# Patient Record
Sex: Male | Born: 2005 | Race: Black or African American | Hispanic: No | Marital: Single | Smoking: Never smoker
Health system: Southern US, Community
[De-identification: ages and names within clinical notes are randomized; demographics above are authoritative.]

## PROBLEM LIST (undated history)

## (undated) DIAGNOSIS — E559 Vitamin D deficiency, unspecified: Secondary | ICD-10-CM

## (undated) DIAGNOSIS — L309 Dermatitis, unspecified: Secondary | ICD-10-CM

## (undated) DIAGNOSIS — E7800 Pure hypercholesterolemia, unspecified: Secondary | ICD-10-CM

## (undated) DIAGNOSIS — Z6832 Body mass index (BMI) 32.0-32.9, adult: Secondary | ICD-10-CM

## (undated) DIAGNOSIS — R55 Syncope and collapse: Secondary | ICD-10-CM

## (undated) DIAGNOSIS — K219 Gastro-esophageal reflux disease without esophagitis: Secondary | ICD-10-CM

## (undated) DIAGNOSIS — R011 Cardiac murmur, unspecified: Secondary | ICD-10-CM

## (undated) DIAGNOSIS — Z79899 Other long term (current) drug therapy: Secondary | ICD-10-CM

## (undated) DIAGNOSIS — R519 Headache, unspecified: Secondary | ICD-10-CM

## (undated) DIAGNOSIS — R5383 Other fatigue: Secondary | ICD-10-CM

## (undated) DIAGNOSIS — T7840XA Allergy, unspecified, initial encounter: Secondary | ICD-10-CM

## (undated) DIAGNOSIS — J45909 Unspecified asthma, uncomplicated: Secondary | ICD-10-CM

## (undated) HISTORY — DX: Vitamin D deficiency, unspecified: E55.9

## (undated) HISTORY — DX: Cardiac murmur, unspecified: R01.1

## (undated) HISTORY — DX: Headache, unspecified: R51.9

## (undated) HISTORY — DX: Gastro-esophageal reflux disease without esophagitis: K21.9

## (undated) HISTORY — DX: Pure hypercholesterolemia, unspecified: E78.00

## (undated) HISTORY — DX: Syncope and collapse: R55

## (undated) HISTORY — DX: Allergy, unspecified, initial encounter: T78.40XA

## (undated) HISTORY — DX: Other fatigue: R53.83

## (undated) HISTORY — DX: Other long term (current) drug therapy: Z79.899

## (undated) HISTORY — DX: Body mass index (BMI) 32.0-32.9, adult: Z68.32

## (undated) HISTORY — PX: COLONOSCOPY: SHX174

## (undated) HISTORY — PX: ORCHIECTOMY: SHX2116

---
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
## 2024-02-01 DIAGNOSIS — L219 Seborrheic dermatitis, unspecified: Secondary | ICD-10-CM | POA: Diagnosis not present

## 2024-06-18 ENCOUNTER — Ambulatory Visit (HOSPITAL_COMMUNITY): Payer: Self-pay

## 2024-06-18 ENCOUNTER — Ambulatory Visit (HOSPITAL_COMMUNITY)
Admission: EM | Admit: 2024-06-18 | Discharge: 2024-06-18 | Disposition: A | Discharge status: Home / Self Care | Payer: Self-pay | Source: Home / Self Care

## 2024-06-18 ENCOUNTER — Encounter (HOSPITAL_COMMUNITY): Payer: Self-pay

## 2024-06-18 DIAGNOSIS — M79671 Pain in right foot: Secondary | ICD-10-CM

## 2024-06-18 MED ORDER — IBUPROFEN 800 MG PO TABS: 800.0000 mg | ORAL_TABLET | Freq: Three times a day (TID) | ORAL | 0 refills | Status: AC | PRN

## 2024-06-18 NOTE — ED Provider Notes (Signed)
 " [facility]    CSN: [id number] Arrival date & time: 06/18/24  1704      History   Chief Complaint Chief Complaint  Patient presents with   Toe Pain    HPI Brian Norton is a 19 y.o. male.   This 19 year old male is being seen for complaints of right great toe pain.  He reports 1 week ago he was walking down the stairs and slipped striking his right great toe and foot on the wall.  He reports since that time he has had pain and swelling in his right great toe.  He has taken Tylenol  and ibuprofen  with minimal relief of symptoms.  He has attempted buddy taping without significant relief of symptoms.  He reports numbness in his right great toe.  He denies headache, dizziness.  He denies chest pain, shortness of breath.   Toe Pain Pertinent negatives include no chest pain, no headaches and no shortness of breath.    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 Authorizing Provider  ibuprofen  (ADVIL ) 800 MG tablet Take 1 tablet (800 mg total) by mouth every 8 (eight) hours as needed. 06/18/24  Yes Emanie Behan C, FNP  escitalopram  (LEXAPRO ) 5 MG tablet Take 1 tablet (5 mg total) by mouth at bedtime. Start with 1/2 a  tablet at bedtime, then increase to 1 tablet at bedtime. 08/18/20 09/17/20  Qayumi, Zainab S, MD  fluticasone  (FLOVENT  HFA) 110 MCG/ACT inhaler INHALE 1 PUFF BY MOUTH TWICE DAILY ,  SICK  OR  WELL 10/03/21   Rendell Grumet, MD  mupirocin  ointment (BACTROBAN ) 2 % Apply 1 application topically 2 (two) times daily. 09/28/20   Rendell Grumet, MD  naproxen  (NAPROSYN ) 375 MG tablet Take 1 tablet (375 mg total) by mouth 2 (two) times daily. 03/10/23   Randol Simmonds, MD  pantoprazole  (PROTONIX ) 40 MG tablet Take 1 tablet by mouth once daily 06/14/20   Rendell Grumet, MD  Selenium  Sulfide 2.25 % SHAM Apply 1 application  topically 2 (two) times a week. Apply to damp scalp and shampoo vigorously. 04/27/22   Rendell Grumet, MD  tretinoin  (RETIN-A ) 0.1 % cream Apply topically at bedtime. 04/21/22   Rendell Grumet, MD  VENTOLIN  HFA 108 (90 Base) MCG/ACT inhaler INHALE 2 PUFFS BY MOUTH EVERY 4 HOURS FOR WHEEZING OR SHORTNESS OF BREATH 06/08/22   Akhbari, Rozita, MD    Family History History reviewed. No pertinent family history.  Social History Social History[1]   Allergies   Lactose intolerance (gi) and Lactulose   Review of Systems Review of  Systems  Constitutional:  Positive for activity change. Negative for chills and fever.  Respiratory:  Negative for shortness of breath.   Cardiovascular:  Negative for chest pain.  Musculoskeletal:  Positive for arthralgias and joint swelling.  Skin:  Positive for color change (bruising). Negative for rash.  Neurological:  Negative for dizziness and headaches.  All other systems reviewed and are negative.    Physical Exam Triage Vital Signs ED Triage Vitals  Encounter Vitals Group     BP 06/18/24 1746 115/64     Girls Systolic BP Percentile --      Girls Diastolic BP Percentile --      Boys Systolic BP Percentile --      Boys Diastolic BP Percentile --      Pulse Rate 06/18/24 1746 76     Resp 06/18/24 1746 16     Temp 06/18/24 1746 98.8 F (37.1 C)     Temp Source 06/18/24  1746 Oral     SpO2 06/18/24 1746 96 %     Weight --      Height --      Head Circumference --      Peak Flow --      Pain Score 06/18/24 1747 5     Pain Loc --      Pain Education --      Exclude from Growth Chart --    No data found.  Updated Vital Signs BP 115/64 (BP Location: Left Arm)   Pulse 76   Temp 98.8 F (37.1 C) (Oral)   Resp 16   SpO2 96%   Visual Acuity Right Eye Distance:   Left Eye Distance:   Bilateral Distance:    Right Eye Near:   Left Eye Near:    Bilateral Near:     Physical Exam Vitals and nursing note reviewed.  Constitutional:      General: He is not in acute distress.    Appearance: He is well-developed. He is not toxic-appearing.     Comments: Pleasant male appearing stated age found sitting in chair in no acute distress.  HENT:     Head: Normocephalic and atraumatic.  Eyes:     Conjunctiva/sclera: Conjunctivae normal.  Cardiovascular:     Rate and Rhythm: Normal rate and regular rhythm.     Heart sounds: Normal heart sounds. No murmur heard. Pulmonary:     Effort: Pulmonary effort is normal. No respiratory distress.     Breath sounds: Normal breath sounds.  Musculoskeletal:     Right foot: Decreased range of motion.       Feet:  Feet:     Right foot:     Skin integrity: No skin breakdown, erythema or warmth.     Comments: Decreased range of motion and pain right great toe. Skin:    General: Skin is warm and dry.     Capillary Refill: Capillary refill takes less than 2 seconds.     Findings: Bruising present.     Comments: Right great toe  Neurological:     Mental Status: He is alert and oriented to person, place, and time.  Psychiatric:        Mood and Affect: Mood normal.      UC Treatments / Results  Labs (all labs ordered are listed, but only abnormal results are displayed) Labs Reviewed - No data to display  EKG   Radiology DG Foot Complete Right Result Date: 06/18/2024 EXAM: 3 OR MORE VIEW(S) XRAY OF THE  RIGHT  FOOT 06/18/2024 06:09:22 PM COMPARISON: None available. CLINICAL HISTORY: pain Pain. FINDINGS: BONES AND JOINTS: No acute fracture. No malalignment. SOFT TISSUES: No radiopaque foreign body. IMPRESSION: 1. No acute fracture or dislocation. Electronically signed by: Rogelia Myers MD 06/18/2024 06:17 PM EST RP Workstation: [id number]    Procedures Procedures (including critical care time)  Medications Ordered in UC Medications - No data to display  Initial Impression / Assessment and Plan / UC Course  I have reviewed the triage vital signs and the nursing notes.  Pertinent labs & imaging results that were available during my care of the patient were reviewed by me and considered in my medical decision making (see chart for details).     Vitals and triage reviewed, patient is hemodynamically stable.  Right foot x-ray obtained and is negative for acute fracture or dislocation.  He is placed in a postop shoe.  Advised RICE therapy.  He is given prescription for ibuprofen  800 mg.  Advised Tylenol  as needed.  He is given follow-up information for [facility] if needed.  Plan of care, follow-up care, return precautions given, no questions at this time.  Nursing reports that patient declined postop shoe when she attempted to provide. Final Clinical Impressions(s) / UC Diagnoses   Final diagnoses:  Right foot pain     Discharge Instructions      Your x-ray is negative for acute fracture or dislocation.  We have placed you in a postop shoe for support and comfort.  Rest, ice, elevate the injury to reduce swelling and inflammation.  You have been prescribed ibuprofen .  Take 1 tablet every 8 hours as needed for pain. You can also take Tylenol  as needed for pain.  If your symptoms do not start to improve over the next few days, follow-up with orthopedics.  Information below: [facility] [address] [place], [place]  [address] [phone]  Return if you experience  worsening pain, numbness, tingling, skin color changes, or any other concerning symptoms. If symptoms are severe, please go to the ER. I hope you feel better!!       ED Prescriptions     Medication Sig Dispense Auth. Provider   ibuprofen  (ADVIL ) 800 MG tablet Take 1 tablet (800 mg total) by mouth every 8 (eight) hours as needed. 21 tablet Donnisha Besecker C, FNP      PDMP not reviewed this encounter.    [1]  Social History Tobacco Use   Smoking status: Never   Smokeless tobacco: Never  Vaping Use   Vaping status: Never Used  Substance Use Topics   Alcohol use: No   Drug use: No     Lennice Jon BROCKS, FNP 06/18/24 1858  "

## 2024-06-18 NOTE — ED Triage Notes (Signed)
 Patient presents to the office for right toe pain and swelling after falling several days ago.

## 2024-06-18 NOTE — Discharge Instructions (Addendum)
 Your x-ray is negative for acute fracture or dislocation.  We have placed you in a postop shoe for support and comfort.  Rest, ice, elevate the injury to reduce swelling and inflammation.  You have been prescribed ibuprofen .  Take 1 tablet every 8 hours as needed for pain. You can also take Tylenol  as needed for pain.  If your symptoms do not start to improve over the next few days, follow-up with orthopedics.  Information below: [facility] [address] [place], [place]  [address] [phone]  Return if you experience worsening pain, numbness, tingling, skin color changes, or any other concerning symptoms. If symptoms are severe, please go to the ER. I hope you feel better!!

## 2024-06-18 NOTE — ED Notes (Signed)
 Patient refuse post-op shoe.
